# Patient Record
Sex: Male | Born: 1985 | Race: White | Hispanic: No | Marital: Married | Smoking: Never smoker
Health system: Southern US, Community
[De-identification: ages and names within clinical notes are randomized; demographics above are authoritative.]

---

## 2014-09-26 ENCOUNTER — Ambulatory Visit: Payer: Self-pay | Admitting: Family Medicine

## 2014-09-27 ENCOUNTER — Ambulatory Visit: Payer: Self-pay | Admitting: Emergency Medicine

## 2014-09-28 ENCOUNTER — Ambulatory Visit: Payer: Self-pay | Admitting: Emergency Medicine

## 2016-10-15 IMAGING — CR DG CHEST 2V
2 series · 2 of 2 positions shown · non-contrast
Comparison: None.

CLINICAL DATA: 28-year-old male with fever and pain for 2 days.

EXAM:
CHEST  2 VIEW

[chest lat]
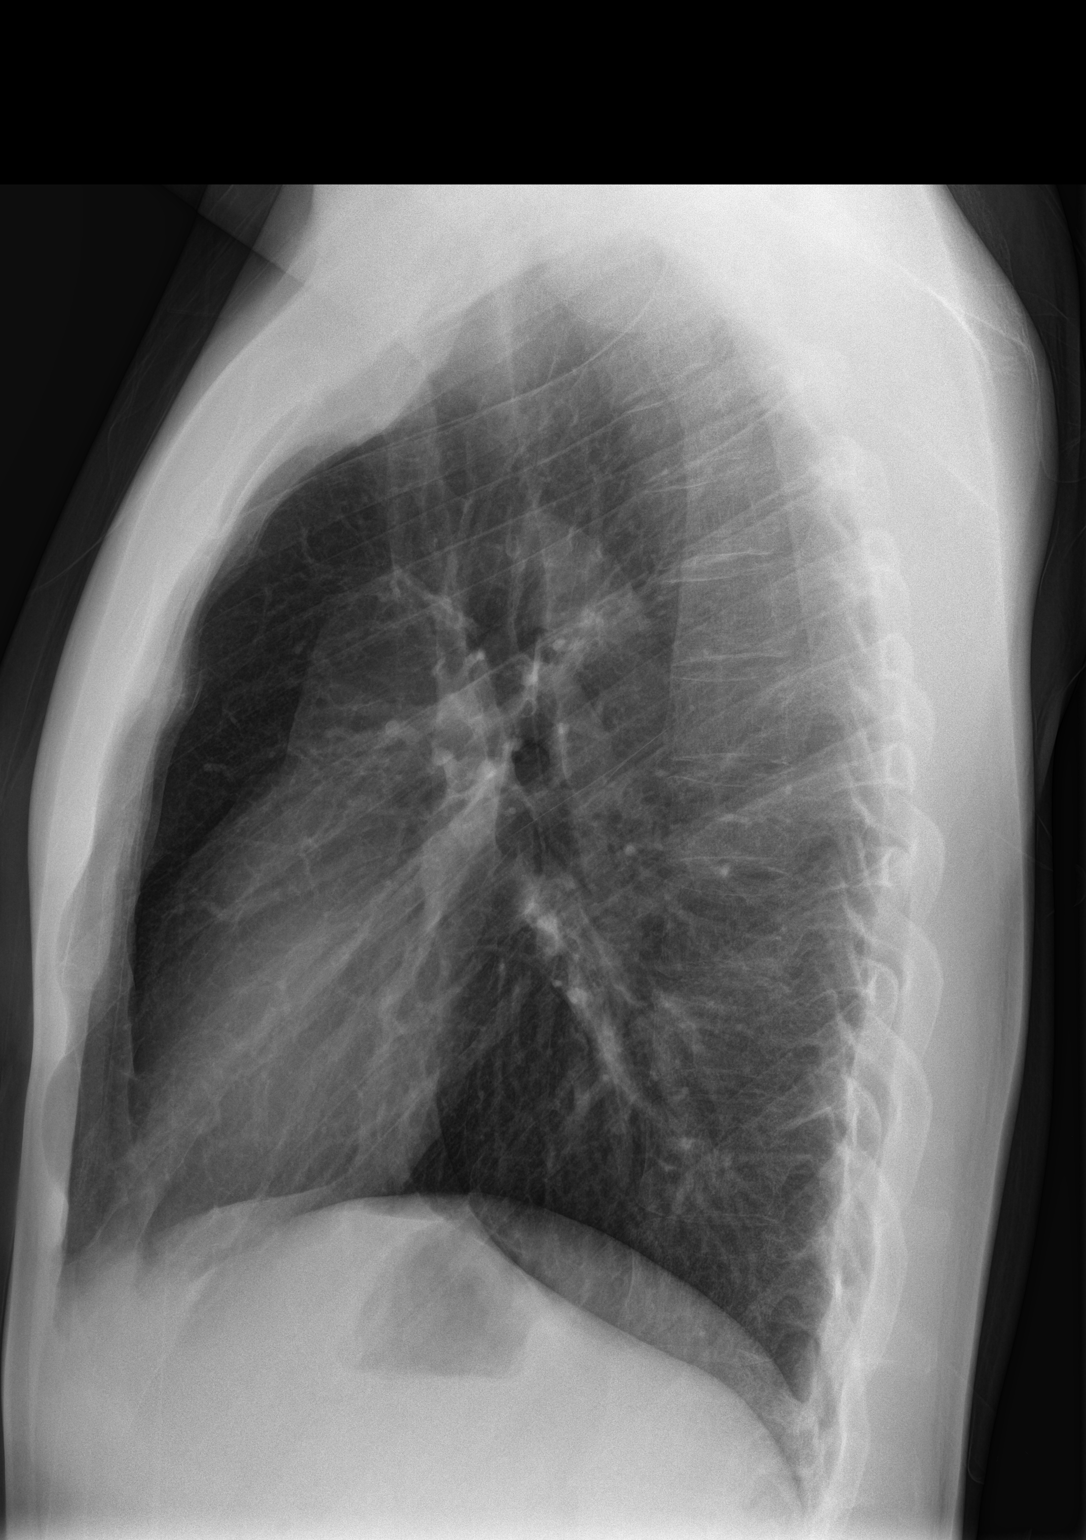

[chest pa]
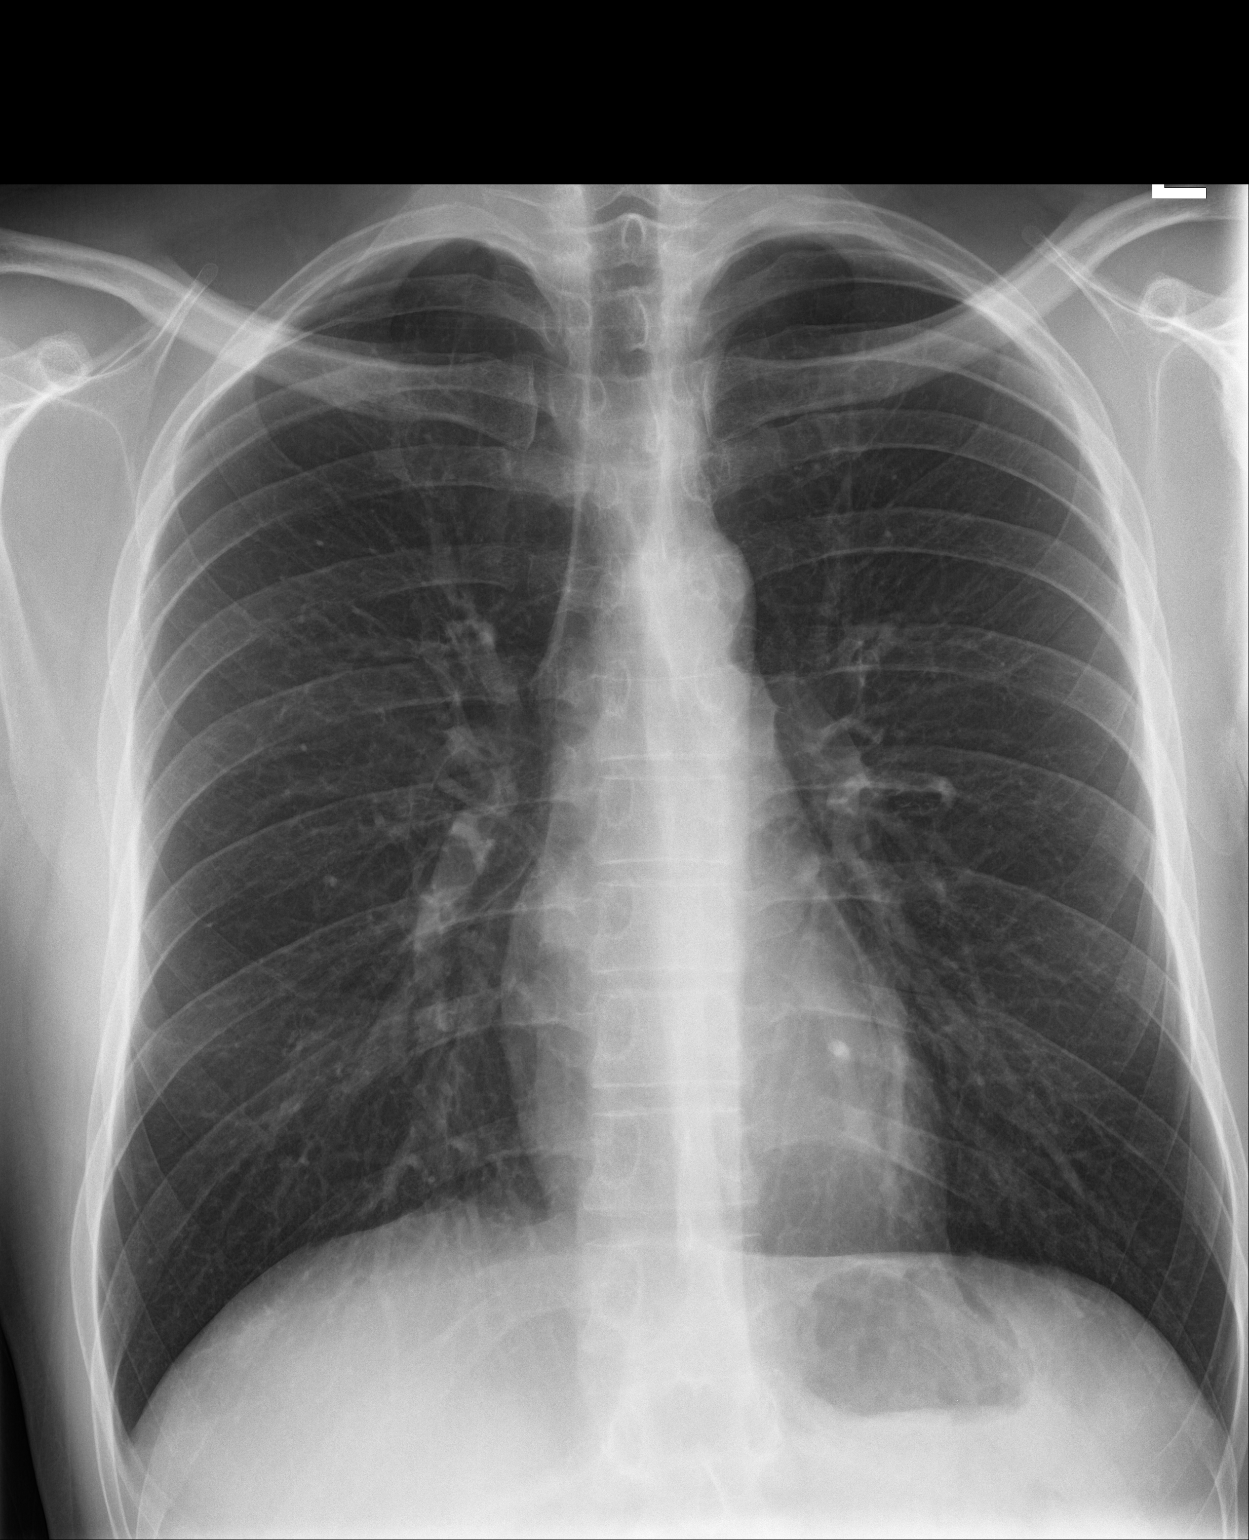

[2 of 2 positions shown; findings below may reference images not displayed]

FINDINGS: The cardiomediastinal silhouette is unremarkable.

There is no evidence of focal airspace disease, pulmonary edema,
suspicious pulmonary nodule/mass, pleural effusion, or pneumothorax.
No acute bony abnormalities are identified.
IMPRESSION: No active cardiopulmonary disease.

## 2020-11-05 ENCOUNTER — Ambulatory Visit: Payer: Self-pay | Admitting: Urology

## 2020-11-09 ENCOUNTER — Ambulatory Visit: Payer: Managed Care, Other (non HMO) | Admitting: Urology

## 2020-11-09 ENCOUNTER — Encounter: Payer: Self-pay | Admitting: Urology

## 2020-11-09 ENCOUNTER — Other Ambulatory Visit: Payer: Self-pay

## 2020-11-09 VITALS — BP 131/86 | HR 84 | Ht 76.0 in | Wt 192.0 lb

## 2020-11-09 DIAGNOSIS — Z3009 Encounter for other general counseling and advice on contraception: Secondary | ICD-10-CM | POA: Diagnosis not present

## 2020-11-09 MED ORDER — DIAZEPAM 5 MG PO TABS: 5.0000 mg | ORAL_TABLET | Freq: Once | ORAL | 0 refills | Status: AC | PRN

## 2020-11-09 NOTE — Patient Instructions (Signed)

## 2020-11-09 NOTE — Progress Notes (Signed)
   11/09/20 10:30 AM   Dennis Jenkins Dennis Jenkins 01-20-1986 [id number]  CC: Discuss vasectomy  HPI: Mr. Dennis Jenkins is a healthy 35 year old male who desires vasectomy for permanent sterilization.  Him and his partner do not desire any biologic pregnancies.  He denies any urinary symptoms or family history of prostate cancer.   Social History:  reports that he has never smoked. He has never used smokeless tobacco. No history on file for alcohol use and drug use.  Physical Exam: BP 131/86   Pulse 84   Ht 6\' 4"  (1.93 m)   Wt 192 lb (87.1 kg)   BMI 23.37 kg/m    Constitutional:  Alert and oriented, No acute distress. Cardiovascular: No clubbing, cyanosis, or edema. Respiratory: Normal respiratory effort, no increased work of breathing. GI: Abdomen is soft, nontender, nondistended, no abdominal masses GU: Uncircumcised phallus with patent meatus, testicles 20 cc and descended bilaterally, no lesions, vas deferens easily palpable bilaterally  Assessment & Plan:   35 year old healthy male who does not desire any biologic pregnancies and is interested in vasectomy for permanent sterilization.  We discussed the risks and benefits of vasectomy at length.  Vasectomy is intended to be a permanent form of contraception, and does not produce immediate sterility.  Following vasectomy another form of contraception is required until vas occlusion is confirmed by a post-vasectomy semen analysis obtained 2-3 months after the procedure.  Even after vas occlusion is confirmed, vasectomy is not 100% reliable in preventing pregnancy, and the failure rate is approximately 07/1998.  Repeat vasectomy is required in less than 1% of patients.  He should refrain from ejaculation for 1 week after vasectomy.  Options for fertility after vasectomy include vasectomy reversal, and sperm retrieval with in vitro fertilization or ICSI.  These options are not always successful and may be expensive.  Finally, there are other  permanent and non-permanent alternatives to vasectomy available. There is no risk of erectile dysfunction, and the volume of semen will be similar to prior, as the majority of the ejaculate is from the prostate and seminal vesicles.   The procedure takes ~20 minutes.  We recommend patients take 5-10 mg of Valium 30 minutes prior, and he will need a driver post-procedure.  Local anesthetic is injected into the scrotal skin and a small segment of the vas deferens is removed, and the ends occluded. The complication rate is approximately 1-2%, and includes bleeding, infection, and development of chronic scrotal pain.  PLAN: Pending insurance approval, will schedule for vasectomy at his convenience  Valium sent to pharmacy  08/1998, MD 11/09/2020  [facility] [address], [address] [place], [place] [phone]

## 2020-12-08 ENCOUNTER — Encounter: Payer: Self-pay | Admitting: Urology

## 2020-12-08 ENCOUNTER — Ambulatory Visit: Payer: Managed Care, Other (non HMO) | Admitting: Urology

## 2020-12-08 ENCOUNTER — Other Ambulatory Visit: Payer: Self-pay

## 2020-12-08 VITALS — BP 132/67 | HR 91 | Ht 76.0 in | Wt 192.0 lb

## 2020-12-08 DIAGNOSIS — Z9852 Vasectomy status: Secondary | ICD-10-CM

## 2020-12-08 HISTORY — PX: VASECTOMY: SHX75

## 2020-12-08 NOTE — Patient Instructions (Addendum)

## 2020-12-08 NOTE — Progress Notes (Signed)
VASECTOMY PROCEDURE NOTE:  The patient was taken to the minor procedure room and placed in the supine position. His genitals were prepped and draped in the usual sterile fashion. The right vas deferens was brought up to the skin of the right upper scrotum. The skin overlying it was anesthetized with 1% lidocaine without epinephrine, anesthetic was also injected alongside the vas deferens in the direction of the inguinal canal. The no scalpel vasectomy instrument was used to make a small perforation in the scrotal skin. The vasectomy clamp was used to grasp the vas deferens. It was carefully dissected free from surrounding structures. A 1cm segment of the vas was removed, and the cut ends of the mucosa were cauterized. No significant bleeding was noted. The vas deferens was returned to the scrotum. The skin incision was closed with a simple interrupted stitch of 4-0 chromic.  Attention was then turned to the left side. The left vasectomy was performed in the same exact fashion. Sterile dressings were placed over each incision. The patient tolerated the procedure well.  IMPRESSION/DIAGNOSIS: The patient is a 35 year old gentleman who underwent a vasectomy today. Post-procedure instructions were reviewed. I stressed the importance of continuing to use birth control until he provides a semen specimen more than 2 months from now that demonstrates azoospermia.  We discussed return precautions including fever over 101, significant bleeding or hematoma, or uncontrolled pain. I also stressed the importance of avoiding strenuous activity for one week, no sexual activity or ejaculations for 5 days, intermittent icing over the next 48 hours, and scrotal support.   PLAN: The patient will be advised of his semen analysis results when available.  Legrand Rams, MD 12/08/2020

## 2021-03-10 ENCOUNTER — Other Ambulatory Visit: Payer: Self-pay

## 2021-03-17 ENCOUNTER — Other Ambulatory Visit: Payer: Self-pay

## 2021-03-17 ENCOUNTER — Other Ambulatory Visit: Payer: Managed Care, Other (non HMO)

## 2021-03-17 DIAGNOSIS — Z9852 Vasectomy status: Secondary | ICD-10-CM

## 2021-03-18 LAB — POST-VAS SPERM EVALUATION,QUAL: Volume: 2 mL

## 2021-03-21 ENCOUNTER — Telehealth: Payer: Self-pay

## 2021-03-21 NOTE — Telephone Encounter (Signed)
Called pt no answer, LM per DPR. Advised pt to call back for questions or concerns.

## 2021-03-21 NOTE — Telephone Encounter (Signed)
-----   Message from Sondra Come, MD sent at 03/21/2021 11:37 AM EDT ----- No sperm seen, okay to discontinue alternative contraception
